# Patient Record
Sex: Female | Born: 1960 | Race: White | Hispanic: No | Marital: Married | Smoking: Never smoker
Health system: Southern US, Community
[De-identification: ages and names within clinical notes are randomized; demographics above are authoritative.]

---

## 2008-04-25 ENCOUNTER — Ambulatory Visit: Payer: Self-pay | Admitting: Physician Assistant

## 2008-06-07 ENCOUNTER — Ambulatory Visit: Payer: Self-pay | Admitting: Physician Assistant

## 2008-06-09 ENCOUNTER — Ambulatory Visit: Payer: Self-pay | Admitting: Physician Assistant

## 2009-09-17 ENCOUNTER — Ambulatory Visit: Payer: Self-pay | Admitting: Internal Medicine

## 2010-07-03 ENCOUNTER — Ambulatory Visit: Payer: Self-pay | Admitting: Internal Medicine
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
## 2010-09-10 ENCOUNTER — Ambulatory Visit: Payer: Self-pay | Admitting: Internal Medicine

## 2010-09-18 ENCOUNTER — Ambulatory Visit: Payer: Self-pay | Admitting: Internal Medicine

## 2010-10-30 ENCOUNTER — Ambulatory Visit: Payer: Self-pay | Admitting: Internal Medicine

## 2011-02-07 ENCOUNTER — Ambulatory Visit: Payer: Self-pay | Admitting: Internal Medicine

## 2011-03-24 ENCOUNTER — Ambulatory Visit: Payer: Self-pay | Admitting: Internal Medicine

## 2011-08-21 ENCOUNTER — Ambulatory Visit: Payer: Self-pay | Admitting: Internal Medicine

## 2011-11-25 ENCOUNTER — Ambulatory Visit: Payer: Self-pay
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
## 2012-10-13 IMAGING — CR DG KNEE 1-2V*L*
1 series · 2 of 2 positions shown · non-contrast
Comparison: none

REASON FOR EXAM: joint pain mult jts
COMMENTS:

PROCEDURE:     MDR - MDR KNEE LT AP AND LATERAL  - September 18, 2010  [DATE]
RESULT:     Comparison: None.

[Series 1: view not recorded · 0.17mm/px · 2 of 2 slices shown]
[im 1/2]
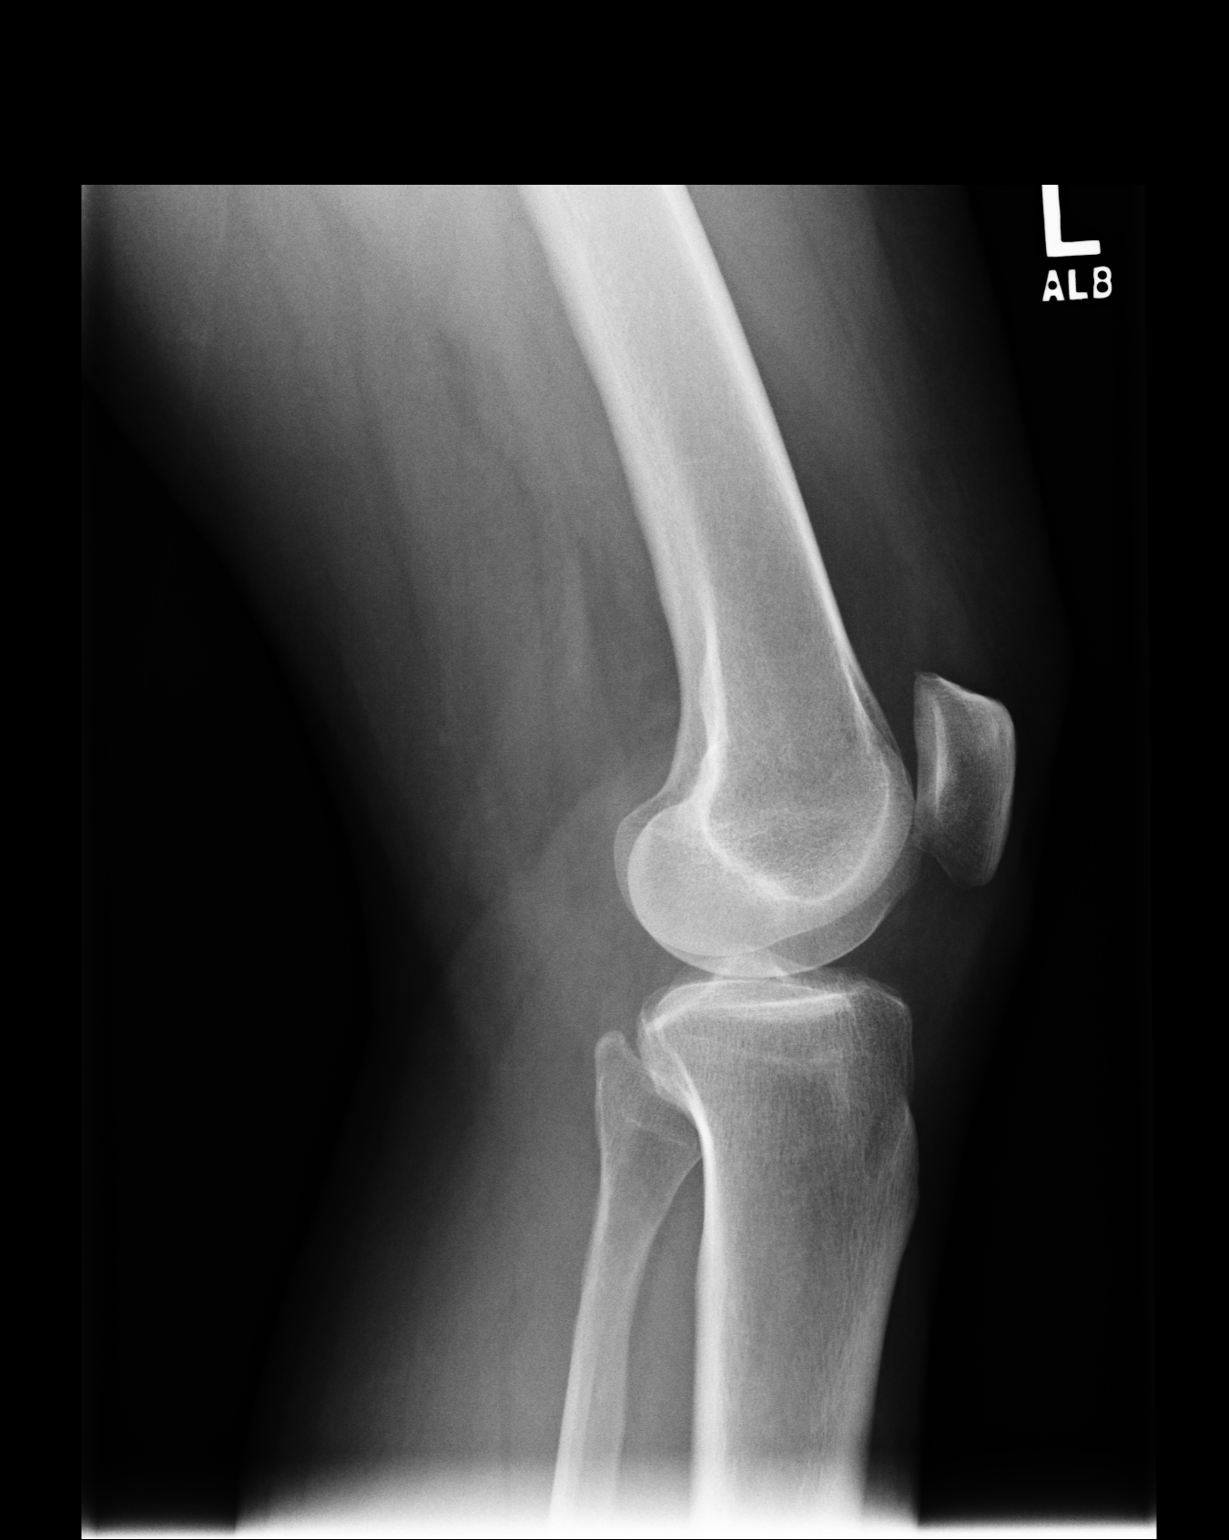
[im 2/2]
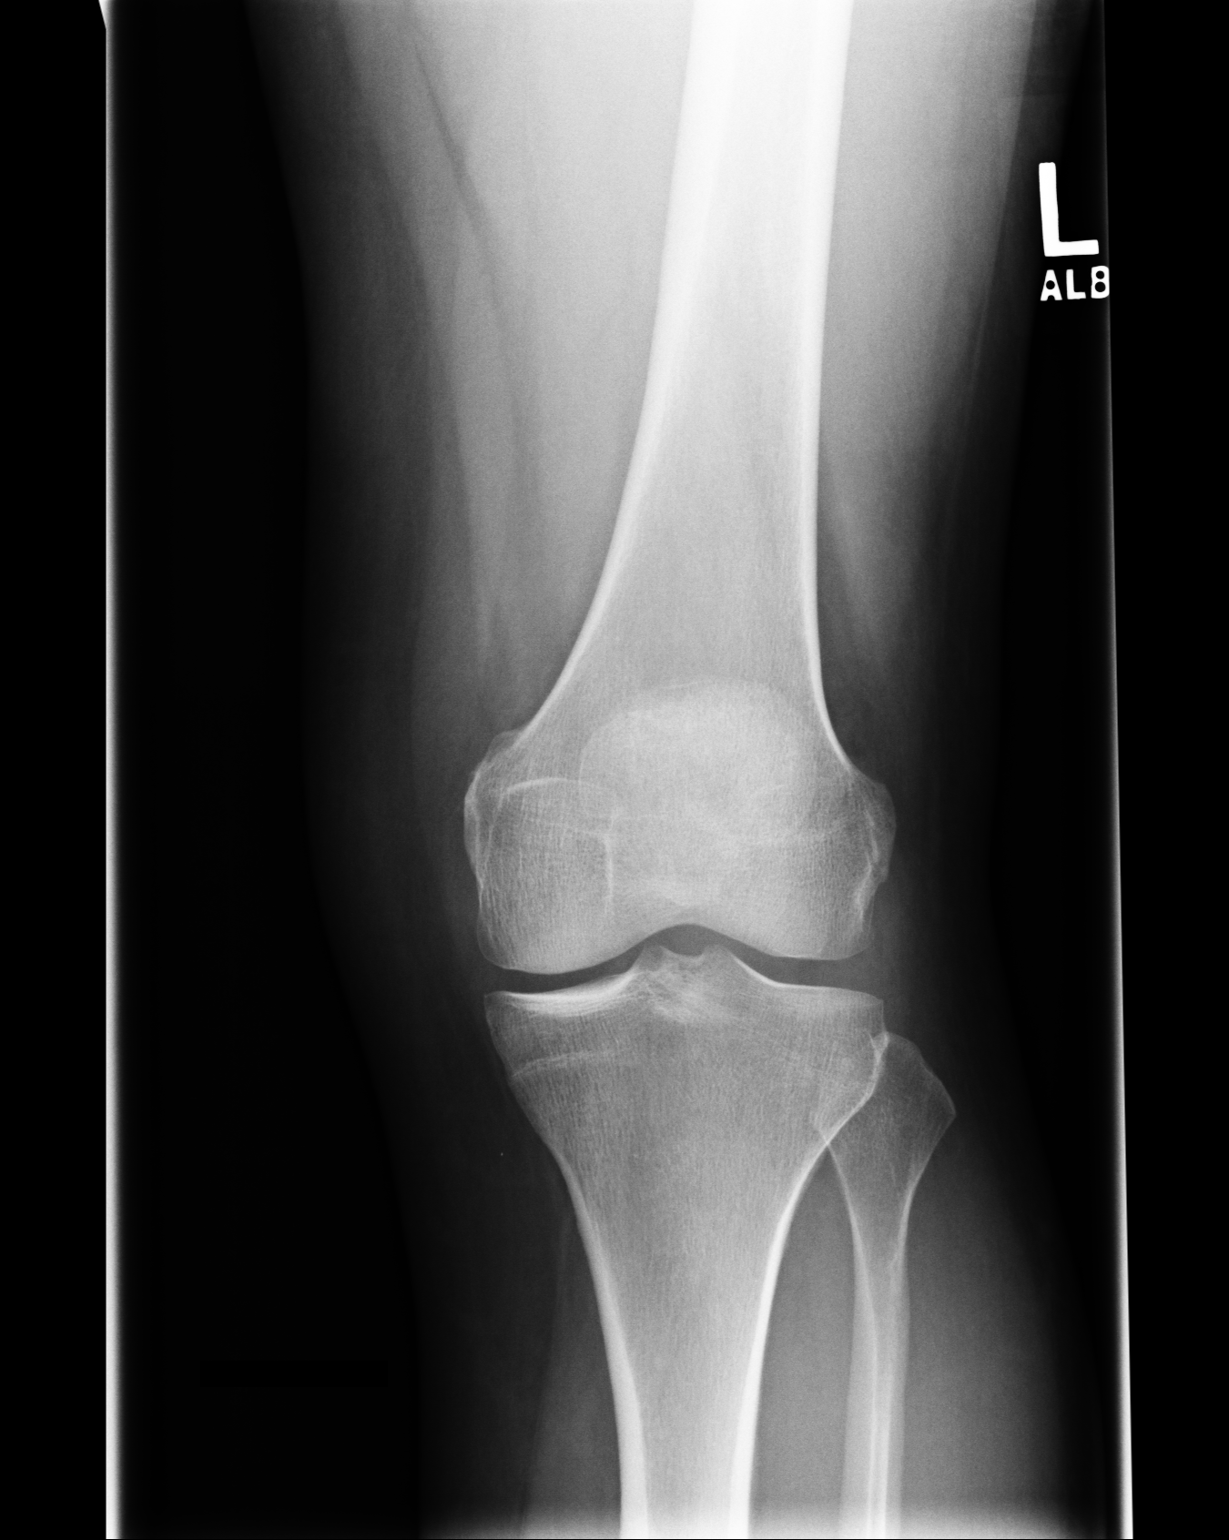

[2 of 2 positions shown; findings below may reference images not displayed]

FINDINGS: Negative for effusion. No acute fracture. Joint spaces are maintained.
IMPRESSION: Unremarkable left knee.

## 2013-04-14 ENCOUNTER — Ambulatory Visit: Payer: Self-pay | Admitting: Internal Medicine

## 2013-04-14 LAB — BASIC METABOLIC PANEL
BUN: 9 mg/dL (ref 4–21)
CREATININE: 0.7 mg/dL (ref <=1.1)

## 2013-04-14 LAB — TSH: TSH: 0.8 u[IU]/mL (ref <=5.90)

## 2013-04-14 LAB — LIPID PANEL
CHOLESTEROL: 198 mg/dL (ref 0–200)
HDL: 43 mg/dL (ref 35–70)
LDL Cholesterol: 108 mg/dL
Triglycerides: 235 mg/dL — AB (ref 40–160)

## 2013-04-14 LAB — CBC AND DIFFERENTIAL: Hemoglobin: 14.3 g/dL (ref 12.0–16.0)

## 2013-05-31 HISTORY — PX: COLONOSCOPY: SHX174

## 2013-09-28 LAB — HM PAP SMEAR: HM PAP: NEGATIVE

## 2015-06-23 ENCOUNTER — Telehealth: Payer: Self-pay

## 2015-06-23 ENCOUNTER — Other Ambulatory Visit: Payer: Self-pay | Admitting: Internal Medicine

## 2015-06-23 DIAGNOSIS — Z1239 Encounter for other screening for malignant neoplasm of breast: Secondary | ICD-10-CM

## 2015-06-23 NOTE — Telephone Encounter (Signed)
Patient called and wants mammogram scheduled and she has appointment for physical on December 27.

## 2015-07-17 ENCOUNTER — Encounter: Payer: Self-pay | Admitting: Internal Medicine

## 2015-07-17 DIAGNOSIS — A6000 Herpesviral infection of urogenital system, unspecified: Secondary | ICD-10-CM | POA: Insufficient documentation

## 2015-07-17 DIAGNOSIS — R609 Edema, unspecified: Secondary | ICD-10-CM | POA: Insufficient documentation

## 2015-07-17 DIAGNOSIS — R87619 Unspecified abnormal cytological findings in specimens from cervix uteri: Secondary | ICD-10-CM | POA: Insufficient documentation

## 2015-09-26 ENCOUNTER — Encounter: Payer: Self-pay | Admitting: Internal Medicine

## 2015-09-26 ENCOUNTER — Ambulatory Visit (INDEPENDENT_AMBULATORY_CARE_PROVIDER_SITE_OTHER): Payer: BLUE CROSS/BLUE SHIELD | Admitting: Internal Medicine

## 2015-09-26 ENCOUNTER — Ambulatory Visit
Admission: RE | Admit: 2015-09-26 | Discharge: 2015-09-26 | Disposition: A | Discharge status: Home / Self Care | Payer: BLUE CROSS/BLUE SHIELD | Source: Ambulatory Visit | Attending: Internal Medicine | Admitting: Internal Medicine

## 2015-09-26 VITALS — BP 120/78 | HR 84 | Ht 60.0 in | Wt 156.4 lb

## 2015-09-26 DIAGNOSIS — R7989 Other specified abnormal findings of blood chemistry: Secondary | ICD-10-CM

## 2015-09-26 DIAGNOSIS — M129 Arthropathy, unspecified: Secondary | ICD-10-CM

## 2015-09-26 DIAGNOSIS — Z1159 Encounter for screening for other viral diseases: Secondary | ICD-10-CM

## 2015-09-26 DIAGNOSIS — E782 Mixed hyperlipidemia: Secondary | ICD-10-CM

## 2015-09-26 DIAGNOSIS — R739 Hyperglycemia, unspecified: Secondary | ICD-10-CM

## 2015-09-26 DIAGNOSIS — Z0000 Encounter for general adult medical examination without abnormal findings: Secondary | ICD-10-CM | POA: Diagnosis not present

## 2015-09-26 DIAGNOSIS — M19012 Primary osteoarthritis, left shoulder: Secondary | ICD-10-CM | POA: Insufficient documentation

## 2015-09-26 DIAGNOSIS — Z114 Encounter for screening for human immunodeficiency virus [HIV]: Secondary | ICD-10-CM

## 2015-09-26 DIAGNOSIS — E119 Type 2 diabetes mellitus without complications: Secondary | ICD-10-CM | POA: Insufficient documentation

## 2015-09-26 DIAGNOSIS — R945 Abnormal results of liver function studies: Secondary | ICD-10-CM

## 2015-09-26 DIAGNOSIS — Z1239 Encounter for other screening for malignant neoplasm of breast: Secondary | ICD-10-CM

## 2015-09-26 DIAGNOSIS — Z1231 Encounter for screening mammogram for malignant neoplasm of breast: Secondary | ICD-10-CM | POA: Insufficient documentation

## 2015-09-26 LAB — POCT URINALYSIS DIPSTICK
BILIRUBIN UA: NEGATIVE
Blood, UA: NEGATIVE
GLUCOSE UA: NEGATIVE
KETONES UA: NEGATIVE
LEUKOCYTES UA: NEGATIVE
Nitrite, UA: NEGATIVE
PROTEIN UA: NEGATIVE
SPEC GRAV UA: 1.01
Urobilinogen, UA: 0.2
pH, UA: 7

## 2015-09-26 NOTE — Progress Notes (Signed)
Date:  09/26/2015   Name:  Meredith Mahoney   DOB:  1961-01-01   MRN:  [id number]   Chief Complaint: Annual Exam Meredith Mahoney is a 54 y.o. female who presents today for her Complete Annual Exam. She feels well. She reports exercising none regularly. She reports she is sleeping poorly due to shift hour work staring at 3 AM. She is due for a mammogram.  She denies breast problems.  She has not had a menstrual cycle since 08/2013.  She has mild hot flashes throughout the day but these are tolerable.  She prefers not to take medication. Shoulder Pain  The pain is present in the left shoulder. This is a recurrent problem. The current episode started more than 1 month ago. There has been no history of extremity trauma. The problem occurs daily. The problem has been unchanged. Pertinent negatives include no fever. 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 She has tried acetaminophen for the symptoms.     Review of Systems  Constitutional: Negative for fever, chills, fatigue and unexpected weight change.  HENT: Negative for congestion, hearing loss, sinus pressure, trouble swallowing and voice change.   Eyes: Negative for visual disturbance.  Respiratory: Negative for chest tightness, shortness of breath and wheezing.   Cardiovascular: Negative for chest pain, palpitations and leg swelling.  Gastrointestinal: Negative for abdominal pain, constipation and blood in stool.  Genitourinary: Negative for vaginal bleeding, vaginal discharge, vaginal pain and dyspareunia.  Musculoskeletal: Positive for arthralgias. Negative for joint swelling and gait problem.  Neurological: Negative for tremors and headaches.  Hematological: Negative for adenopathy. Does not bruise/bleed easily.  Psychiatric/Behavioral: Positive for sleep disturbance (due to shift work). Negative for dysphoric mood.    Patient Active Problem List   Diagnosis Date Noted  . Elevated LFTs 09/26/2015    . Hyperglycemia 09/26/2015  . Mixed hyperlipidemia 09/26/2015  . Arthritis of shoulder region, left 09/26/2015  . Dependent edema 07/17/2015  . Abnormal cervical Papanicolaou smear 07/17/2015  . Genital herpes 07/17/2015    Prior to Admission medications   Not on File    No Known Allergies  Past Surgical History  Procedure Laterality Date  . Colonoscopy  05/2013    one polyp - f/u 3 yr    Social History  Substance Use Topics  . Smoking status: Never Smoker   . Smokeless tobacco: None  . Alcohol Use: 1.2 oz/week    2 Standard drinks or equivalent per week    Medication list has been reviewed and updated.  Physical Exam  Constitutional: She is oriented to person, place, and time. She appears well-developed and well-nourished. No distress.  HENT:  Head: Normocephalic and atraumatic.  Right Ear: Tympanic membrane and ear canal normal.  Left Ear: Tympanic membrane and ear canal normal.  Nose: Right sinus exhibits no maxillary sinus tenderness. Left sinus exhibits no maxillary sinus tenderness.  Mouth/Throat: Uvula is midline and oropharynx is clear and moist.  Eyes: Conjunctivae and EOM are normal. Right eye exhibits no discharge. Left eye exhibits no discharge.  Neck: Normal range of motion. Carotid bruit is not present. No erythema present. No thyromegaly present.  Cardiovascular: Normal rate, regular rhythm, normal heart sounds and normal pulses.   Pulmonary/Chest: Effort normal. No respiratory distress. She has no wheezes. Right breast exhibits no mass, no nipple discharge, no skin change and no tenderness. Left breast exhibits no mass, no nipple discharge, no skin change and no tenderness.  Abdominal:  Soft. Bowel sounds are normal. There is no hepatosplenomegaly. There is no tenderness. There is no CVA tenderness.  Musculoskeletal:       Left shoulder: She exhibits bony tenderness. She exhibits no swelling, no effusion and no crepitus.  Normal range of motion in all  directions but mild discomfort over [facility] joint  Lymphadenopathy:    She has no cervical adenopathy.    She has no axillary adenopathy.  Neurological: She is alert and oriented to person, place, and time. She has normal reflexes. No cranial nerve deficit or sensory deficit.  Skin: Skin is warm, dry and intact. No rash noted.  Psychiatric: She has a normal mood and affect. Her speech is normal and behavior is normal. Thought content normal.  Nursing note and vitals reviewed.   BP 120/78 mmHg  Pulse 84  Ht 5' (1.524 m)  Wt 156 lb 6.4 oz (70.943 kg)  BMI 30.54 kg/m2  Assessment and Plan: 1. Annual physical exam Now in menopause since 08/2013 Minimal symptoms present - HRT not indicated at this time Pt will schedule Mammogram - CBC with Differential/Platelet  2. Mixed hyperlipidemia Continue healthy diet - Lipid panel  3. Elevated LFTs Avoid high doses of nsaids - Comprehensive metabolic panel  4. Hyperglycemia - TSH - Hemoglobin A1c  5. Need for hepatitis C screening test - Hepatitis C antibody  6. Encounter for screening for HIV - HIV antibody  7. Arthritis of shoulder region, left Continue tylenol as needed Refer to Ortho if worsening   Bari Edward, MD [facility]  09/26/2015

## 2015-09-26 NOTE — Patient Instructions (Signed)
Breast Self-Awareness Practicing breast self-awareness may pick up problems early, prevent significant medical complications, and possibly save your life. By practicing breast self-awareness, you can become familiar with how your breasts look and feel and if your breasts are changing. This allows you to notice changes early. It can also offer you some reassurance that your breast health is good. One way to learn what is normal for your breasts and whether your breasts are changing is to do a breast self-exam. If you find a lump or something that was not present in the past, it is best to contact your caregiver right away. Other findings that should be evaluated by your caregiver include nipple discharge, especially if it is bloody; skin changes or reddening; areas where the skin seems to be pulled in (retracted); or new lumps and bumps. Breast pain is seldom associated with cancer (malignancy), but should also be evaluated by a caregiver. HOW TO PERFORM A BREAST SELF-EXAM The best time to examine your breasts is 5-7 days after your menstrual period is over. During menstruation, the breasts are lumpier, and it may be more difficult to pick up changes. If you do not menstruate, have reached menopause, or had your uterus removed (hysterectomy), you should examine your breasts at regular intervals, such as monthly. If you are breastfeeding, examine your breasts after a feeding or after using a breast pump. Breast implants do not decrease the risk for lumps or tumors, so continue to perform breast self-exams as recommended. Talk to your caregiver about how to determine the difference between the implant and breast tissue. Also, talk about the amount of pressure you should use during the exam. Over time, you will become more familiar with the variations of your breasts and more comfortable with the exam. A breast self-exam requires you to remove all your clothes above the waist. 1. Look at your breasts and nipples.  Stand in front of a mirror in a room with good lighting. With your hands on your hips, push your hands firmly downward. Look for a difference in shape, contour, and size from one breast to the other (asymmetry). Asymmetry includes puckers, dips, or bumps. Also, look for skin changes, such as reddened or scaly areas on the breasts. Look for nipple changes, such as discharge, dimpling, repositioning, or redness. 2. Carefully feel your breasts. This is best done either in the shower or tub while using soapy water or when flat on your back. Place the arm (on the side of the breast you are examining) above your head. Use the pads (not the fingertips) of your three middle fingers on your opposite hand to feel your breasts. Start in the underarm area and use  inch (2 cm) overlapping circles to feel your breast. Use 3 different levels of pressure (light, medium, and firm pressure) at each circle before moving to the next circle. The light pressure is needed to feel the tissue closest to the skin. The medium pressure will help to feel breast tissue a little deeper, while the firm pressure is needed to feel the tissue close to the ribs. Continue the overlapping circles, moving downward over the breast until you feel your ribs below your breast. Then, move one finger-width towards the center of the body. Continue to use the  inch (2 cm) overlapping circles to feel your breast as you move slowly up toward the collar bone (clavicle) near the base of the neck. Continue the up and down exam using all 3 pressures until you reach the   middle of the chest. Do this with each breast, carefully feeling for lumps or changes. 3.  Keep a written record with breast changes or normal findings for each breast. By writing this information down, you do not need to depend only on memory for size, tenderness, or location. Write down where you are in your menstrual cycle, if you are still menstruating. Breast tissue can have some lumps or  thick tissue. However, see your caregiver if you find anything that concerns you.  SEEK MEDICAL CARE IF:  You see a change in shape, contour, or size of your breasts or nipples.   You see skin changes, such as reddened or scaly areas on the breasts or nipples.   You have an unusual discharge from your nipples.   You feel a new lump or unusually thick areas.    This information is not intended to replace advice given to you by your health care provider. Make sure you discuss any questions you have with your health care provider.   Document Released: 09/16/2005 Document Revised: 09/02/2012 Document Reviewed: 01/01/2012 Elsevier Interactive Patient Education 2016 [employer]  

## 2015-09-27 ENCOUNTER — Other Ambulatory Visit: Payer: Self-pay | Admitting: Internal Medicine

## 2015-09-27 DIAGNOSIS — E119 Type 2 diabetes mellitus without complications: Secondary | ICD-10-CM

## 2015-09-27 LAB — COMPREHENSIVE METABOLIC PANEL
ALBUMIN: 5 g/dL (ref 3.5–5.5)
ALT: 74 IU/L — ABNORMAL HIGH (ref 0–32)
AST: 61 IU/L — AB (ref 0–40)
Albumin/Globulin Ratio: 1.6 (ref 1.1–2.5)
Alkaline Phosphatase: 108 IU/L (ref 39–117)
BUN / CREAT RATIO: 23 (ref 9–23)
BUN: 13 mg/dL (ref 6–24)
Bilirubin Total: 0.6 mg/dL (ref 0.0–1.2)
CALCIUM: 10.4 mg/dL — AB (ref 8.7–10.2)
CO2: 27 mmol/L (ref 18–29)
CREATININE: 0.56 mg/dL — AB (ref 0.57–1.00)
Chloride: 98 mmol/L (ref 96–106)
GFR calc Af Amer: 123 mL/min/{1.73_m2} (ref >59)
GFR, EST NON AFRICAN AMERICAN: 107 mL/min/{1.73_m2} (ref >59)
GLOBULIN, TOTAL: 3.2 g/dL (ref 1.5–4.5)
GLUCOSE: 129 mg/dL — AB (ref 65–99)
Potassium: 4.6 mmol/L (ref 3.5–5.2)
SODIUM: 144 mmol/L (ref 134–144)
Total Protein: 8.2 g/dL (ref 6.0–8.5)

## 2015-09-27 LAB — CBC WITH DIFFERENTIAL/PLATELET
BASOS: 1 %
Basophils Absolute: 0.1 10*3/uL (ref 0.0–0.2)
EOS (ABSOLUTE): 0.2 10*3/uL (ref 0.0–0.4)
EOS: 3 %
HEMATOCRIT: 44.1 % (ref 34.0–46.6)
Hemoglobin: 14.7 g/dL (ref 11.1–15.9)
IMMATURE GRANS (ABS): 0 10*3/uL (ref 0.0–0.1)
IMMATURE GRANULOCYTES: 1 %
LYMPHS: 22 %
Lymphocytes Absolute: 1.3 10*3/uL (ref 0.7–3.1)
MCH: 30 pg (ref 26.6–33.0)
MCHC: 33.3 g/dL (ref 31.5–35.7)
MCV: 90 fL (ref 79–97)
Monocytes Absolute: 0.4 10*3/uL (ref 0.1–0.9)
Monocytes: 7 %
NEUTROS PCT: 66 %
Neutrophils Absolute: 3.9 10*3/uL (ref 1.4–7.0)
PLATELETS: 210 10*3/uL (ref 150–379)
RBC: 4.9 x10E6/uL (ref 3.77–5.28)
RDW: 15.1 % (ref 12.3–15.4)
WBC: 5.8 10*3/uL (ref 3.4–10.8)

## 2015-09-27 LAB — HIV ANTIBODY (ROUTINE TESTING W REFLEX): HIV SCREEN 4TH GENERATION: NONREACTIVE

## 2015-09-27 LAB — LIPID PANEL
Chol/HDL Ratio: 5.5 ratio units — ABNORMAL HIGH (ref 0.0–4.4)
Cholesterol, Total: 237 mg/dL — ABNORMAL HIGH (ref 100–199)
HDL: 43 mg/dL (ref >39)
LDL Calculated: 137 mg/dL — ABNORMAL HIGH (ref 0–99)
TRIGLYCERIDES: 284 mg/dL — AB (ref 0–149)
VLDL CHOLESTEROL CAL: 57 mg/dL — AB (ref 5–40)

## 2015-09-27 LAB — HEMOGLOBIN A1C
Est. average glucose Bld gHb Est-mCnc: 148 mg/dL
Hgb A1c MFr Bld: 6.8 % — ABNORMAL HIGH (ref 4.8–5.6)

## 2015-09-27 LAB — HEPATITIS C ANTIBODY: Hep C Virus Ab: <0.1 s/co ratio (ref 0.0–0.9)

## 2015-09-27 LAB — TSH: TSH: 0.837 u[IU]/mL (ref 0.450–4.500)

## 2015-10-03 ENCOUNTER — Encounter: Payer: Self-pay | Admitting: *Deleted

## 2015-10-03 ENCOUNTER — Encounter: Payer: BLUE CROSS/BLUE SHIELD | Attending: Internal Medicine | Admitting: *Deleted

## 2015-10-03 VITALS — BP 130/88 | Ht 60.0 in | Wt 159.6 lb

## 2015-10-03 DIAGNOSIS — E119 Type 2 diabetes mellitus without complications: Secondary | ICD-10-CM | POA: Diagnosis present

## 2015-10-03 NOTE — Patient Instructions (Addendum)
Check blood sugars 2 x day before breakfast and 2 hrs after supper 3-4 x week Exercise:  Begin walking for 15 minutes  3 days a week and gradually increase to 30 minutes 5 x week Eat 3 meals day,  1-2 snacks a day Space meals 4-6 hours apart Avoid sugar sweetened drinks (juices) Bring blood sugar records to the next class Call your doctor for a prescription for:  1. Meter strips (type) One Touch Ultra Blue  checking  3-4 times per week  2. Lancets (type) One Touch Delica  checking  3-4  times per week

## 2015-10-03 NOTE — Progress Notes (Signed)
Diabetes Self-Management Education  Visit Type: First/Initial  Appt. Start Time: 1310 Appt. End Time: 1430  10/03/2015  Ms. Christin BachKathleen Baiza, identified by name and date of birth, is a 55 y.o. female with a diagnosis of Diabetes: Type 2.   ASSESSMENT  Blood pressure 130/88, height 5' (1.524 m), weight 159 lb 9.6 oz (72.394 kg). Body mass index is 31.17 kg/(m^2).      Diabetes Self-Management Education - 10/03/15 1552    Visit Information   Visit Type First/Initial   Initial Visit   Diabetes Type Type 2   Are you currently following a meal plan? No   Are you taking your medications as prescribed? Yes   Date Diagnosed 09/26/15   Health Coping   How would you rate your overall health? Good   Psychosocial Assessment   Patient Belief/Attitude about Diabetes Other (comment)  "confused"   Self-care barriers None   Self-management support Doctor's office;Family   Patient Concerns Weight Control;Problem Solving   Special Needs None   Preferred Learning Style Visual   Learning Readiness Ready   How often do you need to have someone help you when you read instructions, pamphlets, or other written materials from your doctor or pharmacy? 1 - Never   What is the last grade level you completed in school? BS   Complications   Last HgB A1C per patient/outside source 6.8 %  09/26/15   How often do you check your blood sugar? 0 times/day (not testing)  Provided One Touch Ultra Mini meter and instructed on use. BG upon return demonstration was 119 mg/dL at 1:612:15 pm - 4 hrs pp.   Have you had a dilated eye exam in the past 12 months? Yes   Have you had a dental exam in the past 12 months? Yes   Are you checking your feet? No   Dietary Intake   Breakfast 2:30 am - eggs, sausage or bacon, toast   Lunch sandwich with lettuce and tomoato   Snack (afternoon) fruit and yogurt   Dinner meat and vegetables, rice, salad   Beverage(s) water, juice coffee    Exercise   Exercise Type ADL's   Patient  Education   Previous Diabetes Education No   Disease state  Definition of diabetes, type 1 and 2, and the diagnosis of diabetes   Nutrition management  Role of diet in the treatment of diabetes and the relationship between the three main macronutrients and blood glucose level   Physical activity and exercise  Role of exercise on diabetes management, blood pressure control and cardiac health.   Monitoring Taught/evaluated SMBG meter.;Purpose and frequency of SMBG.;Identified appropriate SMBG and/or A1C goals.   Chronic complications Relationship between chronic complications and blood glucose control   Psychosocial adjustment Identified and addressed patients feelings and concerns about diabetes   Individualized Goals (developed by patient)   Reducing Risk Become more fit Lose weight   Outcomes   Expected Outcomes Demonstrated interest in learning. Expect positive outcomes      Individualized Plan for Diabetes Self-Management Training:   Learning Objective:  Patient will have a greater understanding of diabetes self-management. Patient education plan is to attend individual and/or group sessions per assessed needs and concerns.   Plan:   Patient Instructions  Check blood sugars 2 x day before breakfast and 2 hrs after supper 3-4 x week Exercise:  Begin walking for 15 minutes  3 days a week and gradually increase to 30 minutes 5 x week Eat 3 meals day,  1-2 snacks a day Space meals 4-6 hours apart Avoid sugar sweetened drinks (juices) Bring blood sugar records to the next class Call your doctor for a prescription for:  1. Meter strips (type) One Touch Ultra Blue  checking  3-4 times per week  2. Lancets (type) One Touch Delica  checking  3-4  times per week   Expected Outcomes:  Demonstrated interest in learning. Expect positive outcomes  Education material provided:  General Meal Planning Guidelines Simple Meal Plan Meter - One Touch Ultra Mini  If problems or questions,  patient to contact team via:   Sharion Settler, RN, CCM, CDE [phone]  Future DSME appointment:   Tuesday October 10, 2015 for Class 1 in [place]

## 2015-10-04 ENCOUNTER — Other Ambulatory Visit: Payer: Self-pay | Admitting: Internal Medicine

## 2015-10-04 ENCOUNTER — Telehealth: Payer: Self-pay

## 2015-10-04 DIAGNOSIS — E119 Type 2 diabetes mellitus without complications: Secondary | ICD-10-CM

## 2015-10-04 MED ORDER — ONETOUCH DELICA LANCETS 33G MISC: 1.0000 | Freq: Two times a day (BID) | Status: DC

## 2015-10-04 MED ORDER — GLUCOSE BLOOD VI STRP: ORAL_STRIP | Status: DC

## 2015-10-04 NOTE — Telephone Encounter (Signed)
Patient states that she was newly diagnosed with Diabetes and that [facility]. She states that she needs to have her Lancets and Strips sent in for the meter they provided her. She states that she received a One Touch Ultra Mini.

## 2015-10-10 ENCOUNTER — Encounter: Payer: Self-pay | Admitting: Dietician

## 2015-10-10 ENCOUNTER — Encounter: Payer: BLUE CROSS/BLUE SHIELD | Admitting: Dietician

## 2015-10-10 VITALS — Wt 156.4 lb

## 2015-10-10 DIAGNOSIS — E119 Type 2 diabetes mellitus without complications: Secondary | ICD-10-CM | POA: Diagnosis not present

## 2015-10-10 NOTE — Progress Notes (Signed)
Appt. Start Time: 1300 Appt. End Time: 1500  Class 1 Diabetes Overview - define DM; state own type of DM; identify functions of pancreas and insulin; define insulin deficiency vs insulin resistance  Psychosocial - identify DM as a source of stress; state the effects of stress on BG control; verbalize appropriate stress management techniques; identify personal stress issues   Nutritional Management - describe effects of food on blood glucose; identify sources of carbohydrate, protein and fat; verbalize the importance of balance meals in controlling blood glucose; identify meals as well balanced or not; estimate servings of carbohydrate from menus; use food labels to identify servings size, content of carbohydrate, fiber, protein, fat, saturated fat and sodium; recognize food sources of fat, saturated fat, trans fat, sodium and verbalize goals for intake; describe healthful appropriate food choices when dining out   Exercise - describe the effects of exercise on blood glucose and importance of regular exercise in controlling diabetes; state a plan for personal exercise; verbalize contraindications for exercise  Self-Monitoring - state importance of HBGM and demo procedure accurately; use HBGM results to effectively manage diabetes; identify importance of regular HbA1C testing and goals for results  Acute Complications/Sick Day Guidelines - recognize hyperglycemia and hypoglycemia with causes and effects; identify blood glucose results as high, low or in control; list steps in treating and preventing high and low blood glucose; state appropriate measure to manage blood glucose when ill (need for meds, HBGM plan, when to call physician, need for fluids)  Chronic Complications/Foot, Skin, Eye Dental Care - identify possible long-term complications of diabetes (retinopathy, neuropathy, nephropathy, cardiovascular disease, infections); explain steps in prevention and treatment of chronic complications; state  importance of daily self-foot exams; describe how to examine feet and what to look for; explain appropriate eye and dental care  Lifestyle Changes/Goals & Health/Community Resources - state benefits of making appropriate lifestyle changes; identify habits that need to change (meals, tobacco, alcohol); identify strategies to reduce risk factors for personal health; set goals for proper diabetes care; state need for and frequency of healthcare follow-up; describe appropriate community resources for good health (ADA, web sites, apps)   Pregnancy/Sexual Health - define gestational diabetes; state importance of good blood glucose control and birth control prior to pregnancy; state importance of good blood glucose control in preventing sexual problems (impotence, vaginal dryness, infections, loss of desire); state relationship of blood glucose control and pregnancy outcome; describe risk of maternal and fetal complications  Teaching Materials Used: Class 1 Slides/Notebook Diabetes Booklet ID Card  Medic Alert/Medic ID Forms Sleep Evaluation Planning a Balanced Meal Goals for Class

## 2015-10-17 ENCOUNTER — Encounter: Payer: BLUE CROSS/BLUE SHIELD | Admitting: *Deleted

## 2015-10-17 ENCOUNTER — Encounter: Payer: Self-pay | Admitting: *Deleted

## 2015-10-17 VITALS — Wt 154.4 lb

## 2015-10-17 DIAGNOSIS — E119 Type 2 diabetes mellitus without complications: Secondary | ICD-10-CM | POA: Diagnosis not present

## 2015-10-17 NOTE — Progress Notes (Signed)
Appt. Start Time: 0900 Appt. End Time: 1100  [place] Class 2 Diabetes Overview - define DM; state own type of DM; identify functions of pancreas and insulin; define insulin deficiency vs insulin resistance  Psychosocial - identify DM as a source of stress; state the effects of stress on BG control; verbalize appropriate stress management techniques; identify personal stress issues   Nutritional Management - describe effects of food on blood glucose; identify sources of carbohydrate, protein and fat; verbalize the importance of balance meals in controlling blood glucose; identify meals as well balanced or not; estimate servings of carbohydrate from menus; use food labels to identify servings size, content of carbohydrate, fiber, protein, fat, saturated fat and sodium; recognize food sources of fat, saturated fat, trans fat, sodium and verbalize goals for intake; describe healthful appropriate food choices when dining out   Exercise - describe the effects of exercise on blood glucose and importance of regular exercise in controlling diabetes; state a plan for personal exercise; verbalize contraindications for exercise  Medications - state name, dose, timing of currently prescribed medications; describe types of medications available for diabetes  Self-Monitoring - state importance of HBGM and demo procedure accurately; use HBGM results to effectively manage diabetes; identify importance of regular HbA1C testing and goals for results  Acute Complications/Sick Day Guidelines - recognize hyperglycemia and hypoglycemia with causes and effects; identify blood glucose results as high, low or in control; list steps in treating and preventing high and low blood glucose; state appropriate measure to manage blood glucose when ill (need for meds, HBGM plan, when to call physician, need for fluids)  Lifestyle Changes/Goals & Health/Community Resources - state benefits of making appropriate lifestyle changes;  identify habits that need to change (meals, tobacco, alcohol); identify strategies to reduce risk factors for personal health; set goals for proper diabetes care; state need for and frequency of healthcare follow-up; describe appropriate community resources for good health (ADA, web sites, apps)   Location manager Used: Class 2 Slide Packet Exercise Handout Daily Food Record and Menu Ideas Goals for Class 2

## 2015-10-24 ENCOUNTER — Encounter: Payer: BLUE CROSS/BLUE SHIELD | Admitting: *Deleted

## 2015-10-24 ENCOUNTER — Encounter: Payer: Self-pay | Admitting: *Deleted

## 2015-10-24 VITALS — Wt 153.6 lb

## 2015-10-24 DIAGNOSIS — E119 Type 2 diabetes mellitus without complications: Secondary | ICD-10-CM

## 2015-10-24 NOTE — Progress Notes (Signed)
Appt. Start Time: 0900 Appt. End Time: 1100  [place] Class 3 Psychosocial - identify DM as a source of stress; state the effects of stress on BG control; verbalize appropriate stress management techniques; identify personal stress issues   Exercise - describe the effects of exercise on blood glucose and importance of regular exercise in controlling diabetes; state a plan for personal exercise; verbalize contraindications for exercise  Self-Monitoring - state importance of HBGM and demo procedure accurately; use HBGM results to effectively manage diabetes; identify importance of regular HbA1C testing and goals for results  Acute Complications/Sick Day Guidelines - recognize hyperglycemia and hypoglycemia with causes and effects; identify blood glucose results as high, low or in control; list steps in treating and preventing high and low blood glucose; state appropriate measure to manage blood glucose when ill (need for meds, HBGM plan, when to call physician, need for fluids)  Chronic Complications/Foot, Skin, Eye Dental Care - identify possible long-term complications of diabetes (retinopathy, neuropathy, nephropathy, cardiovascular disease, infections); explain steps in prevention and treatment of chronic complications; state importance of daily self-foot exams; describe how to examine feet and what to look for; explain appropriate eye and dental care  Lifestyle Changes/Goals & Health/Community Resources - state benefits of making appropriate lifestyle changes; identify habits that need to change (meals, tobacco, alcohol); identify strategies to reduce risk factors for personal health; set goals for proper diabetes care; state need for and frequency of healthcare follow-up; describe appropriate community resources for good health (ADA, web sites, apps)   Pregnancy/Sexual Health - define gestational diabetes; state importance of good blood glucose control and birth control prior to pregnancy; state  importance of good blood glucose control in preventing sexual problems (impotence, vaginal dryness, infections, loss of desire); state relationship of blood glucose control and pregnancy outcome; describe risk of maternal and fetal complications  Teaching Materials Used: Class 3 Slide Packet  Hgb A1C Handout Foot Care Handout Diabetes Stress Test Stress Management Tools Goals for Class 3    

## 2015-10-31 ENCOUNTER — Encounter: Payer: BLUE CROSS/BLUE SHIELD | Admitting: Dietician

## 2015-10-31 ENCOUNTER — Encounter: Payer: Self-pay | Admitting: Dietician

## 2015-10-31 VITALS — BP 130/78 | Wt 153.4 lb

## 2015-10-31 DIAGNOSIS — E119 Type 2 diabetes mellitus without complications: Secondary | ICD-10-CM | POA: Diagnosis not present

## 2015-10-31 NOTE — Progress Notes (Signed)
Appt. Start Time: 9:00  Appt. End Time: 10:30am  [place] Class 4 Nutritional Management - describe effects of food on blood glucose; identify sources of carbohydrate, protein and fat; verbalize the importance of balance meals in controlling blood glucose; identify meals as well balanced or not; estimate servings of carbohydrate from menus; use food labels to identify servings size, content of carbohydrate, fiber, protein, fat, saturated fat and sodium; recognize food sources of fat, saturated fat, trans fat, sodium and verbalize goals for intake; describe healthful appropriate food choices when dining out   Self-Monitoring - state importance of HBGM and demo procedure accurately; use HBGM results to effectively manage diabetes; identify importance of regular HbA1C testing and goals for results  Chronic Complications/Foot, Skin, Eye Dental Care - identify possible long-term complications of diabetes (retinopathy, neuropathy, nephropathy, cardiovascular disease, infections); explain steps in prevention and treatment of chronic complications; state importance of daily self-foot exams; describe how to examine feet and what to look for; explain appropriate eye and dental care  Lifestyle Changes/Goals & Health/Community Resources - state benefits of making appropriate lifestyle changes; identify habits that need to change (meals, tobacco, alcohol); identify strategies to reduce risk factors for personal health; set goals for proper diabetes care; state need for and frequency of healthcare follow-up; describe appropriate community resources for good health (ADA, web sites, apps)   Location manager Used: Class 4 Slide Packet  Recipe/Menu Booklet Nutrition Prescription Fast Food Information Goal Setting Worksheet

## 2015-11-10 ENCOUNTER — Encounter: Payer: Self-pay | Admitting: *Deleted

## 2016-01-23 ENCOUNTER — Encounter: Payer: Self-pay | Admitting: Internal Medicine

## 2016-01-23 ENCOUNTER — Ambulatory Visit (INDEPENDENT_AMBULATORY_CARE_PROVIDER_SITE_OTHER): Payer: BLUE CROSS/BLUE SHIELD | Admitting: Internal Medicine

## 2016-01-23 VITALS — BP 118/62 | HR 70 | Ht 60.0 in | Wt 144.0 lb

## 2016-01-23 DIAGNOSIS — R7989 Other specified abnormal findings of blood chemistry: Secondary | ICD-10-CM | POA: Diagnosis not present

## 2016-01-23 DIAGNOSIS — E119 Type 2 diabetes mellitus without complications: Secondary | ICD-10-CM | POA: Diagnosis not present

## 2016-01-23 DIAGNOSIS — R945 Abnormal results of liver function studies: Secondary | ICD-10-CM

## 2016-01-23 NOTE — Progress Notes (Signed)
Date:  01/23/2016   Name:  Meredith Mahoney   DOB:  Nov 29, 1960   MRN:  [id number]   Chief Complaint: Diabetes Diabetes She presents for her follow-up diabetic visit. She has type 2 (onset 6 months ago - went to DM ed and has done well.) diabetes mellitus. Her disease course has been stable. There are no hypoglycemic associated symptoms. Pertinent negatives for hypoglycemia include no dizziness or headaches. Pertinent negatives for diabetes include no chest pain, no fatigue, no polydipsia, no polyuria and no weakness. Current diabetic treatment includes diet. She is compliant with treatment all of the time. Her weight is decreasing steadily (has lost 15 lbs). She has had a previous visit with a dietitian.  She is very motivated and pleased with her progress.  She is checking BS 1-2 times per day.  Readings are less than 130 90% of the time.  She is exercising 4-5 times per week.  Lab Results  Component Value Date   HGBA1C 6.8* 09/26/2015   Lab Results  Component Value Date   ALT 74* 09/26/2015   AST 61* 09/26/2015   ALKPHOS 108 09/26/2015   BILITOT 0.6 09/26/2015     Review of Systems  Constitutional: Positive for unexpected weight change (has lost 15 lbs with diet and exercise). Negative for fever, chills and fatigue.  Eyes: Negative for visual disturbance.  Respiratory: Negative for cough, chest tightness and shortness of breath.   Cardiovascular: Negative for chest pain, palpitations and leg swelling.  Endocrine: Negative for polydipsia and polyuria.  Skin: Negative for rash.  Neurological: Negative for dizziness, weakness and headaches.    Patient Active Problem List   Diagnosis Date Noted  . Elevated LFTs 09/26/2015  . DM type 2, goal A1c below 7 09/26/2015  . Mixed hyperlipidemia 09/26/2015  . Arthritis of shoulder region, left 09/26/2015  . Dependent edema 07/17/2015  . Abnormal cervical Papanicolaou smear 07/17/2015  . Genital herpes 07/17/2015    Prior to  Admission medications   Medication Sig Start Date End Date Taking? Authorizing Provider  Ascorbic Acid (VITAMIN C) 1000 MG tablet Take 1,000 mg by mouth daily.    Historical Provider, MD  aspirin 81 MG tablet Take 81 mg by mouth daily.    Historical Provider, MD  calcium-vitamin D 250-100 MG-UNIT tablet Take 1 tablet by mouth daily.     Historical Provider, MD  Cholecalciferol (VITAMIN D-3 PO) Take 1,000 Units by mouth daily.    Historical Provider, MD  CRANBERRY PO Take 2 capsules by mouth daily.    Historical Provider, MD  glucose blood (ONE TOUCH ULTRA TEST) test strip Use as instructed 10/04/15   Reubin Milan, MD  Multiple Vitamins-Minerals (MULTIVITAMIN WITH MINERALS) tablet Take 1 tablet by mouth daily.    Historical Provider, MD  [facility] DELICA LANCETS 33G MISC 1 each by Does not apply route 2 (two) times daily. 10/04/15   Reubin Milan, MD  saccharomyces boulardii (FLORASTOR) 250 MG capsule Take 250 mg by mouth daily.     Historical Provider, MD    No Known Allergies  Past Surgical History  Procedure Laterality Date  . Colonoscopy  05/2013    one polyp - f/u 3 yr    Social History  Substance Use Topics  . Smoking status: Never Smoker   . Smokeless tobacco: Never Used  . Alcohol Use: 0.6 oz/week    1 Glasses of wine per week     Comment: 2 lite beers in last week  Medication list has been reviewed and updated.   Physical Exam  Constitutional: She is oriented to person, place, and time. She appears well-developed. No distress.  HENT:  Head: Normocephalic and atraumatic.  Cardiovascular: Normal rate, regular rhythm and normal heart sounds.   Pulmonary/Chest: Effort normal and breath sounds normal. No respiratory distress.  Musculoskeletal: She exhibits no edema or tenderness.  Neurological: She is alert and oriented to person, place, and time.  Normal sensation, pulses, skin on both feet bilaterally  Skin: Skin is warm and dry. No rash noted.  Psychiatric: She has  a normal mood and affect. Her behavior is normal. Thought content normal.  Nursing note and vitals reviewed.   BP 118/62 mmHg  Pulse 70  Ht 5' (1.524 m)  Wt 144 lb (65.318 kg)  BMI 28.12 kg/m2  Assessment and Plan: 1. DM type 2, goal A1c below 7 Excellent control with diet, weight loss and exercise Continue FSBS daily - can alternate fasting and 2 hr PC If A1C <6.5 will stay the course and re-evaluate at [facility] this fall - Hemoglobin A1c  2. Elevated LFTs Hep C negative Suspect transient effect vs mild fatty liver which should have improved with weight loss and dietary changes - Hepatic function panel   Bari EdwardLaura Jaanvi Fizer, MD [facility]  01/23/2016

## 2016-01-24 ENCOUNTER — Telehealth: Payer: Self-pay

## 2016-01-24 LAB — HEPATIC FUNCTION PANEL
ALK PHOS: 103 IU/L (ref 39–117)
ALT: 24 IU/L (ref 0–32)
AST: 29 IU/L (ref 0–40)
Albumin: 4.8 g/dL (ref 3.5–5.5)
BILIRUBIN, DIRECT: 0.1 mg/dL (ref 0.00–0.40)
Bilirubin Total: 0.4 mg/dL (ref 0.0–1.2)
Total Protein: 7.9 g/dL (ref 6.0–8.5)

## 2016-01-24 LAB — HEMOGLOBIN A1C
Est. average glucose Bld gHb Est-mCnc: 111 mg/dL
Hgb A1c MFr Bld: 5.5 % (ref 4.8–5.6)

## 2016-01-24 NOTE — Telephone Encounter (Signed)
Spoke with patient. Patient advised of all results and verbalized understanding. Will call back with any future questions or concerns. MAH  

## 2016-01-24 NOTE — Telephone Encounter (Signed)
-----   Message from Reubin MilanLaura H Berglund, MD sent at 01/24/2016  8:28 AM EDT ----- A1C is down to 5.5.  Liver functions are normal.  Great work - keep it up!

## 2016-06-24 ENCOUNTER — Encounter: Payer: Self-pay | Admitting: *Deleted

## 2016-08-13 ENCOUNTER — Other Ambulatory Visit: Payer: Self-pay | Admitting: Internal Medicine

## 2016-08-14 ENCOUNTER — Other Ambulatory Visit: Payer: Self-pay | Admitting: Internal Medicine

## 2016-08-14 DIAGNOSIS — Z1231 Encounter for screening mammogram for malignant neoplasm of breast: Secondary | ICD-10-CM

## 2016-09-17 ENCOUNTER — Ambulatory Visit
Admission: RE | Admit: 2016-09-17 | Discharge: 2016-09-17 | Disposition: A | Discharge status: Home / Self Care | Payer: BLUE CROSS/BLUE SHIELD | Source: Ambulatory Visit | Attending: Internal Medicine | Admitting: Internal Medicine

## 2016-09-17 DIAGNOSIS — Z1231 Encounter for screening mammogram for malignant neoplasm of breast: Secondary | ICD-10-CM | POA: Insufficient documentation

## 2016-09-26 ENCOUNTER — Encounter: Payer: Self-pay | Admitting: Internal Medicine

## 2016-09-26 ENCOUNTER — Ambulatory Visit (INDEPENDENT_AMBULATORY_CARE_PROVIDER_SITE_OTHER): Payer: BLUE CROSS/BLUE SHIELD | Admitting: Internal Medicine

## 2016-09-26 ENCOUNTER — Other Ambulatory Visit: Payer: Self-pay | Admitting: Internal Medicine

## 2016-09-26 VITALS — BP 126/82 | HR 68 | Temp 98.6°F | Ht 60.0 in | Wt 150.0 lb

## 2016-09-26 DIAGNOSIS — E119 Type 2 diabetes mellitus without complications: Secondary | ICD-10-CM | POA: Diagnosis not present

## 2016-09-26 DIAGNOSIS — Z0000 Encounter for general adult medical examination without abnormal findings: Secondary | ICD-10-CM | POA: Diagnosis not present

## 2016-09-26 DIAGNOSIS — E782 Mixed hyperlipidemia: Secondary | ICD-10-CM

## 2016-09-26 DIAGNOSIS — R945 Abnormal results of liver function studies: Secondary | ICD-10-CM

## 2016-09-26 DIAGNOSIS — R7989 Other specified abnormal findings of blood chemistry: Secondary | ICD-10-CM

## 2016-09-26 DIAGNOSIS — R609 Edema, unspecified: Secondary | ICD-10-CM

## 2016-09-26 LAB — POCT URINALYSIS DIPSTICK
BILIRUBIN UA: NEGATIVE
Blood, UA: NEGATIVE
Glucose, UA: NEGATIVE
Ketones, UA: NEGATIVE
LEUKOCYTES UA: NEGATIVE
NITRITE UA: NEGATIVE
PH UA: 6
Protein, UA: NEGATIVE
Spec Grav, UA: 1.01
Urobilinogen, UA: 0.2

## 2016-09-26 NOTE — Progress Notes (Signed)
Date:  09/26/2016   Name:  Meredith Mahoney   DOB:  [birth year]   MRN:  [id number]   Chief Complaint: Annual Exam Meredith Mahoney is a 55 y.o. female who presents today for her Complete Annual Exam. She feels well. She reports exercising regularly. She reports she is sleeping well. She recently had her mammogram.  She continues to exercise but has gained back some of the weight she gained. She has a colonoscopy scheduled for January. She declines influenza vaccine.  Diabetes  She presents for her follow-up diabetic visit. She has type 2 diabetes mellitus. Her disease course has been stable. Pertinent negatives for hypoglycemia include no dizziness, headaches, nervousness/anxiousness or tremors. Pertinent negatives for diabetes include no chest pain, no fatigue, no polydipsia and no polyuria. Her weight is fluctuating minimally. She is following a generally healthy diet. She participates in exercise three times a week. There is no change in her home blood glucose trend. An ACE inhibitor/angiotensin II receptor blocker is not being taken.  Hyperlipidemia  This is a chronic problem. Pertinent negatives include no chest pain or shortness of breath. Current antihyperlipidemic treatment includes diet change and exercise.   Lab Results  Component Value Date   HGBA1C 5.5 01/23/2016     Review of Systems  Constitutional: Negative for chills, fatigue and fever.  HENT: Negative for congestion, hearing loss, tinnitus, trouble swallowing and voice change.   Eyes: Negative for visual disturbance.  Respiratory: Negative for cough, chest tightness, shortness of breath and wheezing.   Cardiovascular: Negative for chest pain, palpitations and leg swelling.  Gastrointestinal: Negative for abdominal pain, constipation, diarrhea and vomiting.  Endocrine: Negative for polydipsia and polyuria.  Genitourinary: Negative for dysuria, frequency, genital sores, vaginal bleeding and vaginal discharge.    Musculoskeletal: Negative for arthralgias, gait problem and joint swelling.  Skin: Negative for color change and rash.  Neurological: Negative for dizziness, tremors, light-headedness and headaches.  Hematological: Negative for adenopathy. Does not bruise/bleed easily.  Psychiatric/Behavioral: Negative for dysphoric mood and sleep disturbance. The patient is not nervous/anxious.     Patient Active Problem List   Diagnosis Date Noted  . Elevated LFTs 09/26/2015  . Type 2 diabetes mellitus with hemoglobin A1c goal of less than 7.0% (HCC) 09/26/2015  . Mixed hyperlipidemia 09/26/2015  . Arthritis of shoulder region, left 09/26/2015  . Dependent edema 07/17/2015  . Abnormal cervical Papanicolaou smear 07/17/2015  . Genital herpes 07/17/2015    Prior to Admission medications   Medication Sig Start Date End Date Taking? Authorizing Provider  Ascorbic Acid (VITAMIN C) 1000 MG tablet Take 1,000 mg by mouth daily.   Yes Historical Provider, MD  aspirin 81 MG tablet Take 81 mg by mouth daily.   Yes Historical Provider, MD  Biotin [id number] MCG TABS Take by mouth.   Yes Historical Provider, MD  Cholecalciferol (VITAMIN D-3 PO) Take 1,000 Units by mouth daily.   Yes Historical Provider, MD  CRANBERRY PO Take 2 capsules by mouth daily.   Yes Historical Provider, MD  glucose blood (ONE TOUCH ULTRA TEST) test strip Use as instructed 10/04/15  Yes Reubin MilanLaura H Kenni Newton, MD  Multiple Vitamins-Minerals (MULTIVITAMIN WITH MINERALS) tablet Take 1 tablet by mouth daily.   Yes Historical Provider, MD  [facility] DELICA LANCETS 33G MISC 1 each by Does not apply route 2 (two) times daily. 10/04/15  Yes Reubin MilanLaura H Gianny Killman, MD  saccharomyces boulardii (FLORASTOR) 250 MG capsule Take 250 mg by mouth daily.    Yes Historical  Provider, MD    No Known Allergies  Past Surgical History:  Procedure Laterality Date  . COLONOSCOPY  05/2013   one polyp - f/u 3 yr    Social History  Substance Use Topics  . Smoking status: Never  Smoker  . Smokeless tobacco: Never Used  . Alcohol use 0.6 oz/week    1 Glasses of wine per week     Comment: 2 lite beers in last week     Medication list has been reviewed and updated.   Physical Exam  Constitutional: She is oriented to person, place, and time. She appears well-developed and well-nourished. No distress.  HENT:  Head: Normocephalic and atraumatic.  Right Ear: Tympanic membrane and ear canal normal.  Left Ear: Tympanic membrane and ear canal normal.  Nose: Right sinus exhibits no maxillary sinus tenderness. Left sinus exhibits no maxillary sinus tenderness.  Mouth/Throat: Uvula is midline and oropharynx is clear and moist.  Eyes: Conjunctivae and EOM are normal. Right eye exhibits no discharge. Left eye exhibits no discharge. No scleral icterus.  Neck: Normal range of motion. Carotid bruit is not present. No erythema present. No thyromegaly present.  Cardiovascular: Normal rate, regular rhythm, normal heart sounds and normal pulses.   Pulmonary/Chest: Effort normal. No respiratory distress. She has no wheezes. Right breast exhibits no mass, no nipple discharge, no skin change and no tenderness. Left breast exhibits no mass, no nipple discharge, no skin change and no tenderness.  Abdominal: Soft. Bowel sounds are normal. There is no hepatosplenomegaly. There is no tenderness. There is no CVA tenderness.  Genitourinary: Vagina normal and uterus normal. There is no tenderness, lesion or injury on the right labia. There is no tenderness, lesion or injury on the left labia. Cervix exhibits no motion tenderness. Right adnexum displays no mass, no tenderness and no fullness. Left adnexum displays no mass, no tenderness and no fullness.  Musculoskeletal: Normal range of motion.  Lymphadenopathy:    She has no cervical adenopathy.    She has no axillary adenopathy.  Neurological: She is alert and oriented to person, place, and time. She has normal reflexes. No cranial nerve  deficit or sensory deficit.  Skin: Skin is warm, dry and intact. No rash noted.  Psychiatric: She has a normal mood and affect. Her speech is normal and behavior is normal. Thought content normal.  Nursing note and vitals reviewed.   BP 126/82   Pulse 68   Temp 98.6 F (37 C)   Ht 5' (1.524 m)   Wt 150 lb (68 kg)   SpO2 99%   BMI 29.29 kg/m   Assessment and Plan: 1. Annual physical exam Continue diet and exercise - Pap IG and HPV (high risk) DNA detection - POCT urinalysis dipstick  2. Type 2 diabetes mellitus with hemoglobin A1c goal of less than 7.0% (HCC) - Hemoglobin A1c - TSH - Microalbumin / creatinine urine ratio  3. Elevated LFTs recheck - Comprehensive metabolic panel  4. Mixed hyperlipidemia Will advise if medication is needed - Lipid panel  5. Dependent edema resolved - CBC with Differential/Platelet   Bari EdwardLaura Nixxon Faria, MD [facility]  09/26/2016

## 2016-09-26 NOTE — Patient Instructions (Signed)
Breast Self-Awareness Introduction Breast self-awareness means being familiar with how your breasts look and feel. It involves checking your breasts regularly and reporting any changes to your health care provider. Practicing breast self-awareness is important. A change in your breasts can be a sign of a serious medical problem. Being familiar with how your breasts look and feel allows you to find any problems early, when treatment is more likely to be successful. All women should practice breast self-awareness, including women who have had breast implants. How to do a breast self-exam One way to learn what is normal for your breasts and whether your breasts are changing is to do a breast self-exam. To do a breast self-exam: Look for Changes  1. Remove all the clothing above your waist. 2. Stand in front of a mirror in a room with good lighting. 3. Put your hands on your hips. 4. Push your hands firmly downward. 5. Compare your breasts in the mirror. Look for differences between them (asymmetry), such as:  Differences in shape.  Differences in size.  Puckers, dips, and bumps in one breast and not the other. 6. Look at each breast for changes in your skin, such as:  Redness.  Scaly areas. 7. Look for changes in your nipples, such as:  Discharge.  Bleeding.  Dimpling.  Redness.  A change in position. Feel for Changes  Carefully feel your breasts for lumps and changes. It is best to do this while lying on your back on the floor and again while sitting or standing in the shower or tub with soapy water on your skin. Feel each breast in the following way:  Place the arm on the side of the breast you are examining above your head.  Feel your breast with the other hand.  Start in the nipple area and make  inch (2 cm) overlapping circles to feel your breast. Use the pads of your three middle fingers to do this. Apply light pressure, then medium pressure, then firm pressure. The light  pressure will allow you to feel the tissue closest to the skin. The medium pressure will allow you to feel the tissue that is a little deeper. The firm pressure will allow you to feel the tissue close to the ribs.  Continue the overlapping circles, moving downward over the breast until you feel your ribs below your breast.  Move one finger-width toward the center of the body. Continue to use the  inch (2 cm) overlapping circles to feel your breast as you move slowly up toward your collarbone.  Continue the up and down exam using all three pressures until you reach your armpit. Write Down What You Find  Write down what is normal for each breast and any changes that you find. Keep a written record with breast changes or normal findings for each breast. By writing this information down, you do not need to depend only on memory for size, tenderness, or location. Write down where you are in your menstrual cycle, if you are still menstruating. If you are having trouble noticing differences in your breasts, do not get discouraged. With time you will become more familiar with the variations in your breasts and more comfortable with the exam. How often should I examine my breasts? Examine your breasts every month. If you are breastfeeding, the best time to examine your breasts is after a feeding or after using a breast pump. If you menstruate, the best time to examine your breasts is 5-7 days after your   period is over. During your period, your breasts are lumpier, and it may be more difficult to notice changes. When should I see my health care provider? See your health care provider if you notice:  A change in shape or size of your breasts or nipples.  A change in the skin of your breast or nipples, such as a reddened or scaly area.  Unusual discharge from your nipples.  A lump or thick area that was not there before.  Pain in your breasts.  Anything that concerns you. This information is not  intended to replace advice given to you by your health care provider. Make sure you discuss any questions you have with your health care provider. Document Released: 09/16/2005 Document Revised: 02/22/2016 Document Reviewed: 08/06/2015  2017 [employer]vier  

## 2016-09-27 LAB — COMPREHENSIVE METABOLIC PANEL
A/G RATIO: 1.9 (ref 1.2–2.2)
ALBUMIN: 4.9 g/dL (ref 3.5–5.5)
ALK PHOS: 101 IU/L (ref 39–117)
ALT: 27 IU/L (ref 0–32)
AST: 25 IU/L (ref 0–40)
BUN / CREAT RATIO: 23 (ref 9–23)
BUN: 13 mg/dL (ref 6–24)
Bilirubin Total: 0.4 mg/dL (ref 0.0–1.2)
CO2: 24 mmol/L (ref 18–29)
Calcium: 10.3 mg/dL — ABNORMAL HIGH (ref 8.7–10.2)
Chloride: 99 mmol/L (ref 96–106)
Creatinine, Ser: 0.57 mg/dL (ref 0.57–1.00)
GFR calc Af Amer: 122 mL/min/{1.73_m2} (ref >59)
GFR calc non Af Amer: 105 mL/min/{1.73_m2} (ref >59)
GLOBULIN, TOTAL: 2.6 g/dL (ref 1.5–4.5)
Glucose: 109 mg/dL — ABNORMAL HIGH (ref 65–99)
POTASSIUM: 5.2 mmol/L (ref 3.5–5.2)
SODIUM: 140 mmol/L (ref 134–144)
Total Protein: 7.5 g/dL (ref 6.0–8.5)

## 2016-09-27 LAB — CBC WITH DIFFERENTIAL/PLATELET
BASOS: 1 %
Basophils Absolute: 0.1 10*3/uL (ref 0.0–0.2)
EOS (ABSOLUTE): 0.3 10*3/uL (ref 0.0–0.4)
EOS: 4 %
HEMATOCRIT: 42.5 % (ref 34.0–46.6)
HEMOGLOBIN: 14.1 g/dL (ref 11.1–15.9)
Immature Grans (Abs): 0 10*3/uL (ref 0.0–0.1)
Immature Granulocytes: 0 %
LYMPHS ABS: 1.7 10*3/uL (ref 0.7–3.1)
Lymphs: 26 %
MCH: 29.2 pg (ref 26.6–33.0)
MCHC: 33.2 g/dL (ref 31.5–35.7)
MCV: 88 fL (ref 79–97)
MONOS ABS: 0.5 10*3/uL (ref 0.1–0.9)
Monocytes: 7 %
NEUTROS ABS: 4.2 10*3/uL (ref 1.4–7.0)
Neutrophils: 62 %
Platelets: 223 10*3/uL (ref 150–379)
RBC: 4.83 x10E6/uL (ref 3.77–5.28)
RDW: 14.8 % (ref 12.3–15.4)
WBC: 6.7 10*3/uL (ref 3.4–10.8)

## 2016-09-27 LAB — LIPID PANEL
CHOL/HDL RATIO: 4.9 ratio — AB (ref 0.0–4.4)
Cholesterol, Total: 219 mg/dL — ABNORMAL HIGH (ref 100–199)
HDL: 45 mg/dL (ref >39)
LDL Calculated: 122 mg/dL — ABNORMAL HIGH (ref 0–99)
Triglycerides: 258 mg/dL — ABNORMAL HIGH (ref 0–149)
VLDL CHOLESTEROL CAL: 52 mg/dL — AB (ref 5–40)

## 2016-09-27 LAB — TSH: TSH: 1.2 u[IU]/mL (ref 0.450–4.500)

## 2016-09-27 LAB — MICROALBUMIN / CREATININE URINE RATIO
Creatinine, Urine: 12.4 mg/dL
Microalb/Creat Ratio: <24.2 mg/g creat (ref 0.0–30.0)
Microalbumin, Urine: <3 ug/mL

## 2016-09-27 LAB — HEMOGLOBIN A1C
ESTIMATED AVERAGE GLUCOSE: 103 mg/dL
HEMOGLOBIN A1C: 5.2 % (ref 4.8–5.6)

## 2016-09-28 LAB — PAP IG AND HPV HIGH-RISK
HPV, HIGH-RISK: NEGATIVE
PAP Smear Comment: 0

## 2016-11-29 ENCOUNTER — Other Ambulatory Visit: Payer: Self-pay | Admitting: Internal Medicine

## 2016-11-29 DIAGNOSIS — E119 Type 2 diabetes mellitus without complications: Secondary | ICD-10-CM

## 2017-02-28 ENCOUNTER — Other Ambulatory Visit: Payer: Self-pay | Admitting: Internal Medicine

## 2017-02-28 DIAGNOSIS — E119 Type 2 diabetes mellitus without complications: Secondary | ICD-10-CM

## 2017-03-24 ENCOUNTER — Ambulatory Visit (INDEPENDENT_AMBULATORY_CARE_PROVIDER_SITE_OTHER): Payer: BLUE CROSS/BLUE SHIELD | Admitting: Internal Medicine

## 2017-03-24 VITALS — BP 138/82 | HR 68 | Ht 60.0 in | Wt 145.0 lb

## 2017-03-24 DIAGNOSIS — E782 Mixed hyperlipidemia: Secondary | ICD-10-CM | POA: Diagnosis not present

## 2017-03-24 DIAGNOSIS — E119 Type 2 diabetes mellitus without complications: Secondary | ICD-10-CM

## 2017-03-24 DIAGNOSIS — R7989 Other specified abnormal findings of blood chemistry: Secondary | ICD-10-CM | POA: Diagnosis not present

## 2017-03-24 DIAGNOSIS — R945 Abnormal results of liver function studies: Secondary | ICD-10-CM

## 2017-03-24 NOTE — Progress Notes (Signed)
Date:  03/24/2017   Name:  Meredith Mahoney   DOB:  1961-08-28   MRN:  [id number]   Chief Complaint: Diabetes (BS this morning 121.) and Hyperlipidemia (Pt is fasting. ) Diabetes  She presents for her follow-up diabetic visit. She has type 2 diabetes mellitus. Her disease course has been stable. Pertinent negatives for hypoglycemia include no headaches or tremors. Pertinent negatives for diabetes include no chest pain, no fatigue, no polydipsia and no polyuria. Current diabetic treatment includes diet. She is compliant with treatment most of the time. Her weight is stable. An ACE inhibitor/angiotensin II receptor blocker is not being taken.  Hyperlipidemia  This is a chronic problem. Pertinent negatives include no chest pain or shortness of breath. Current antihyperlipidemic treatment includes nicotinic acid.    Lab Results  Component Value Date   HGBA1C 5.2 09/26/2016   Lab Results  Component Value Date   CHOL 219 (H) 09/26/2016   HDL 45 09/26/2016   LDLCALC 122 (H) 09/26/2016   TRIG 258 (H) 09/26/2016   CHOLHDL 4.9 (H) 09/26/2016     Review of Systems  Constitutional: Negative for appetite change, fatigue, fever and unexpected weight change.  HENT: Negative for tinnitus and trouble swallowing.   Eyes: Negative for visual disturbance.  Respiratory: Negative for cough, chest tightness and shortness of breath.   Cardiovascular: Negative for chest pain, palpitations and leg swelling.  Gastrointestinal: Negative for abdominal pain.  Endocrine: Negative for polydipsia and polyuria.  Genitourinary: Negative for dysuria and hematuria.  Musculoskeletal: Negative for arthralgias.  Neurological: Negative for tremors, numbness and headaches.  Psychiatric/Behavioral: Negative for dysphoric mood.    Patient Active Problem List   Diagnosis Date Noted  . Elevated LFTs 09/26/2015  . Type 2 diabetes mellitus with hemoglobin A1c goal of less than 7.0% (HCC) 09/26/2015  . Mixed  hyperlipidemia 09/26/2015  . Arthritis of shoulder region, left 09/26/2015  . Dependent edema 07/17/2015  . Abnormal cervical Papanicolaou smear 07/17/2015  . Genital herpes 07/17/2015    Prior to Admission medications   Medication Sig Start Date End Date Taking? Authorizing Provider  Ascorbic Acid (VITAMIN C) 1000 MG tablet Take 1,000 mg by mouth daily.    [provider]  aspirin 81 MG tablet Take 81 mg by mouth daily.    [provider]  Biotin 40981 MCG TABS Take by mouth.    [provider]  Cholecalciferol (VITAMIN D-3 PO) Take 1,000 Units by mouth daily.    [provider]  CRANBERRY PO Take 2 capsules by mouth daily.    [provider]  Multiple Vitamins-Minerals (MULTIVITAMIN WITH MINERALS) tablet Take 1 tablet by mouth daily.    [provider]  ONE TOUCH ULTRA TEST test strip USE AS INSTRUCTED 11/29/16   Reubin Milan, MD  [facility] DELICA LANCETS 33G MISC USE TWICE DAILY 02/28/17   Reubin Milan, MD  saccharomyces boulardii (FLORASTOR) 250 MG capsule Take 250 mg by mouth daily.     [provider]    No Known Allergies  Past Surgical History:  Procedure Laterality Date  . COLONOSCOPY  05/2013   one polyp - f/u 3 yr    Social History  Substance Use Topics  . Smoking status: Never Smoker  . Smokeless tobacco: Never Used  . Alcohol use 0.6 oz/week    1 Glasses of wine per week     Comment: 2 lite beers in last week     Medication list has been reviewed  and updated.   Physical Exam  Constitutional: She is oriented to person, place, and time. She appears well-developed. No distress.  HENT:  Head: Normocephalic and atraumatic.  Neck: Carotid bruit is not present.  Cardiovascular: Normal rate, regular rhythm and normal heart sounds.   Pulmonary/Chest: Effort normal and breath sounds normal. No respiratory distress. She has no wheezes.  Musculoskeletal: Normal range of motion.  Neurological: She is  alert and oriented to person, place, and time.  Skin: Skin is warm and dry. No rash noted.  Psychiatric: She has a normal mood and affect. Her behavior is normal. Thought content normal.  Nursing note and vitals reviewed.   BP 138/82   Pulse 68   Ht 5' (1.524 m)   Wt 145 lb (65.8 kg)   SpO2 99%   BMI 28.32 kg/m   Assessment and Plan: 1. Type 2 diabetes mellitus with hemoglobin A1c goal of less than 7.0% (HCC) Doing well on diet and exercise - Comprehensive metabolic panel - Hemoglobin A1c  2. Mixed hyperlipidemia Now on Niacin - recheck labs - Lipid panel  3. Elevated LFTs recheck   No orders of the defined types were placed in this encounter.   Bari EdwardLaura Guynell Kleiber, MD [facility]  03/24/2017

## 2017-03-25 LAB — LIPID PANEL
CHOLESTEROL TOTAL: 204 mg/dL — AB (ref 100–199)
Chol/HDL Ratio: 4.4 ratio (ref 0.0–4.4)
HDL: 46 mg/dL (ref >39)
LDL Calculated: 111 mg/dL — ABNORMAL HIGH (ref 0–99)
TRIGLYCERIDES: 236 mg/dL — AB (ref 0–149)
VLDL Cholesterol Cal: 47 mg/dL — ABNORMAL HIGH (ref 5–40)

## 2017-03-25 LAB — COMPREHENSIVE METABOLIC PANEL
A/G RATIO: 1.5 (ref 1.2–2.2)
ALK PHOS: 103 IU/L (ref 39–117)
ALT: 27 IU/L (ref 0–32)
AST: 32 IU/L (ref 0–40)
Albumin: 4.8 g/dL (ref 3.5–5.5)
BUN/Creatinine Ratio: 26 — ABNORMAL HIGH (ref 9–23)
BUN: 14 mg/dL (ref 6–24)
Bilirubin Total: 0.6 mg/dL (ref 0.0–1.2)
CALCIUM: 10.1 mg/dL (ref 8.7–10.2)
CO2: 24 mmol/L (ref 20–29)
Chloride: 101 mmol/L (ref 96–106)
Creatinine, Ser: 0.54 mg/dL — ABNORMAL LOW (ref 0.57–1.00)
GFR calc Af Amer: 124 mL/min/{1.73_m2} (ref >59)
GFR, EST NON AFRICAN AMERICAN: 107 mL/min/{1.73_m2} (ref >59)
GLOBULIN, TOTAL: 3.2 g/dL (ref 1.5–4.5)
Glucose: 93 mg/dL (ref 65–99)
POTASSIUM: 4.4 mmol/L (ref 3.5–5.2)
SODIUM: 143 mmol/L (ref 134–144)
Total Protein: 8 g/dL (ref 6.0–8.5)

## 2017-03-25 LAB — HEMOGLOBIN A1C
Est. average glucose Bld gHb Est-mCnc: 105 mg/dL
Hgb A1c MFr Bld: 5.3 % (ref 4.8–5.6)

## 2017-05-03 ENCOUNTER — Other Ambulatory Visit: Payer: Self-pay | Admitting: Internal Medicine

## 2017-05-03 DIAGNOSIS — E119 Type 2 diabetes mellitus without complications: Secondary | ICD-10-CM

## 2017-05-07 ENCOUNTER — Other Ambulatory Visit: Payer: Self-pay

## 2017-05-07 ENCOUNTER — Other Ambulatory Visit: Payer: Self-pay | Admitting: Internal Medicine

## 2017-05-07 ENCOUNTER — Encounter: Payer: Self-pay | Admitting: Internal Medicine

## 2017-05-07 DIAGNOSIS — E119 Type 2 diabetes mellitus without complications: Secondary | ICD-10-CM

## 2017-05-07 MED ORDER — ONETOUCH DELICA LANCETS 33G MISC: 1 refills | Status: AC

## 2017-08-29 ENCOUNTER — Other Ambulatory Visit: Payer: Self-pay | Admitting: Internal Medicine

## 2017-08-29 DIAGNOSIS — Z1231 Encounter for screening mammogram for malignant neoplasm of breast: Secondary | ICD-10-CM

## 2017-09-18 ENCOUNTER — Ambulatory Visit
Admission: RE | Admit: 2017-09-18 | Discharge: 2017-09-18 | Disposition: A | Discharge status: Home / Self Care | Source: Ambulatory Visit | Attending: Internal Medicine | Admitting: Internal Medicine

## 2017-09-18 DIAGNOSIS — Z1231 Encounter for screening mammogram for malignant neoplasm of breast: Secondary | ICD-10-CM | POA: Diagnosis not present

## 2017-09-26 ENCOUNTER — Other Ambulatory Visit: Payer: Self-pay

## 2017-09-26 DIAGNOSIS — E119 Type 2 diabetes mellitus without complications: Secondary | ICD-10-CM

## 2017-09-26 MED ORDER — GLUCOSE BLOOD VI STRP: ORAL_STRIP | 3 refills | Status: AC

## 2017-10-05 ENCOUNTER — Encounter: Payer: Self-pay | Admitting: Internal Medicine

## 2017-10-06 ENCOUNTER — Encounter: Payer: Self-pay | Admitting: Internal Medicine

## 2017-10-06 ENCOUNTER — Other Ambulatory Visit: Payer: Self-pay | Admitting: Internal Medicine

## 2017-10-06 ENCOUNTER — Ambulatory Visit (INDEPENDENT_AMBULATORY_CARE_PROVIDER_SITE_OTHER): Admitting: Internal Medicine

## 2017-10-06 VITALS — BP 124/78 | HR 78 | Ht 60.0 in | Wt 150.6 lb

## 2017-10-06 DIAGNOSIS — R7989 Other specified abnormal findings of blood chemistry: Secondary | ICD-10-CM

## 2017-10-06 DIAGNOSIS — Z0000 Encounter for general adult medical examination without abnormal findings: Secondary | ICD-10-CM

## 2017-10-06 DIAGNOSIS — R945 Abnormal results of liver function studies: Secondary | ICD-10-CM

## 2017-10-06 DIAGNOSIS — E119 Type 2 diabetes mellitus without complications: Secondary | ICD-10-CM | POA: Diagnosis not present

## 2017-10-06 DIAGNOSIS — L03115 Cellulitis of right lower limb: Secondary | ICD-10-CM | POA: Diagnosis not present

## 2017-10-06 DIAGNOSIS — E782 Mixed hyperlipidemia: Secondary | ICD-10-CM | POA: Diagnosis not present

## 2017-10-06 DIAGNOSIS — Z1239 Encounter for other screening for malignant neoplasm of breast: Secondary | ICD-10-CM

## 2017-10-06 DIAGNOSIS — Z0001 Encounter for general adult medical examination with abnormal findings: Secondary | ICD-10-CM

## 2017-10-06 LAB — POCT URINALYSIS DIPSTICK
BILIRUBIN UA: NEGATIVE
Glucose, UA: NEGATIVE
KETONES UA: NEGATIVE
Leukocytes, UA: NEGATIVE
NITRITE UA: NEGATIVE
PH UA: 6.5 (ref 5.0–8.0)
PROTEIN UA: NEGATIVE
RBC UA: NEGATIVE
SPEC GRAV UA: 1.01 (ref 1.010–1.025)
UROBILINOGEN UA: 0.2 U/dL

## 2017-10-06 MED ORDER — AMOXICILLIN-POT CLAVULANATE 875-125 MG PO TABS: 1.0000 | ORAL_TABLET | Freq: Two times a day (BID) | ORAL | 0 refills | Status: AC

## 2017-10-06 NOTE — Progress Notes (Signed)
Date:  10/06/2017   Name:  Meredith Mahoney   DOB:  [birth year]   MRN:  [id number]   Chief Complaint: Annual Exam (Breast Exam. ); Diabetes; and Hyperlipidemia Meredith Mahoney is a 57 y.o. female who presents today for her Complete Annual Exam. She feels fairly well. She reports exercising at the gym and walking. She reports she is sleeping fairly well. Mammogram is up to date.  She is retired and moving to [place] in the next few weeks.  She plans to establish with a new PCP - probably through [facility].    Diabetes  She presents for her follow-up diabetic visit. She has type 2 diabetes mellitus. Pertinent negatives for hypoglycemia include no dizziness, headaches, nervousness/anxiousness or tremors. Pertinent negatives for diabetes include no chest pain, no fatigue, no polydipsia and no polyuria. She monitors blood glucose at home 3-4 x per week. Blood glucose monitoring compliance is good. Her breakfast blood glucose is taken between 7-8 am. Her breakfast blood glucose range is generally 110-130 mg/dl. An ACE inhibitor/angiotensin II receptor blocker is not being taken.  Hyperlipidemia  This is a chronic problem. There are no known factors aggravating her hyperlipidemia. Pertinent negatives include no chest pain or shortness of breath.   Lab Results  Component Value Date   HGBA1C 5.3 03/24/2017      Review of Systems  Constitutional: Negative for chills, fatigue and fever.  HENT: Negative for congestion, hearing loss, tinnitus, trouble swallowing and voice change.   Eyes: Negative for visual disturbance.  Respiratory: Negative for cough, chest tightness, shortness of breath and wheezing.   Cardiovascular: Negative for chest pain, palpitations and leg swelling.  Gastrointestinal: Negative for abdominal pain, constipation, diarrhea and vomiting.  Endocrine: Negative for polydipsia and polyuria.  Genitourinary: Negative for dysuria, frequency, genital sores, vaginal bleeding and vaginal  discharge.  Musculoskeletal: Negative for arthralgias, gait problem and joint swelling.  Skin: Negative for color change and rash.  Neurological: Negative for dizziness, tremors, light-headedness and headaches.  Hematological: Negative for adenopathy. Does not bruise/bleed easily.  Psychiatric/Behavioral: Negative for dysphoric mood and sleep disturbance. The patient is not nervous/anxious.     Patient Active Problem List   Diagnosis Date Noted  . Elevated LFTs 09/26/2015  . Type 2 diabetes mellitus with hemoglobin A1c goal of less than 7.0% (HCC) 09/26/2015  . Mixed hyperlipidemia 09/26/2015  . Arthritis of shoulder region, left 09/26/2015  . Dependent edema 07/17/2015  . Abnormal cervical Papanicolaou smear 07/17/2015  . Genital herpes 07/17/2015    Prior to Admission medications   Medication Sig Start Date End Date Taking? Authorizing Provider  Ascorbic Acid (VITAMIN C) 1000 MG tablet Take 1,000 mg by mouth daily.    [provider]  aspirin 81 MG tablet Take 81 mg by mouth daily.    [provider]  Biotin 78295 MCG TABS Take by mouth.    [provider]  Cholecalciferol (VITAMIN D-3 PO) Take 1,000 Units by mouth daily.    [provider]  CRANBERRY PO Take 2 capsules by mouth daily.    [provider]  glucose blood (ONE TOUCH ULTRA TEST) test strip USE AS INSTRUCTED 09/26/17   Reubin Milan, MD  Multiple Vitamins-Minerals (MULTIVITAMIN WITH MINERALS) tablet Take 1 tablet by mouth daily.    [provider]  niacin (SLO-NIACIN) 500 MG tablet Take 500 mg by mouth at bedtime.    [provider]  [facility] DELICA LANCETS 33G MISC USE TWICE DAILY 05/07/17  Reubin MilanBerglund, Maytte Jacot H, MD  saccharomyces boulardii (FLORASTOR) 250 MG capsule Take 250 mg by mouth daily.     [provider]    No Known Allergies  Past Surgical History:  Procedure Laterality Date  . COLONOSCOPY  05/2013   one polyp - f/u 3 yr    Social  History   Tobacco Use  . Smoking status: Never Smoker  . Smokeless tobacco: Never Used  Substance Use Topics  . Alcohol use: Yes    Alcohol/week: 0.6 oz    Types: 1 Glasses of wine per week    Comment: 2 lite beers in last week  . Drug use: No     Medication list has been reviewed and updated.  PHQ 2/9 Scores 10/06/2017 10/03/2015  PHQ - 2 Score 0 0    Physical Exam  Constitutional: She is oriented to person, place, and time. She appears well-developed and well-nourished. No distress.  HENT:  Head: Normocephalic and atraumatic.  Right Ear: Tympanic membrane and ear canal normal.  Left Ear: Tympanic membrane and ear canal normal.  Nose: Right sinus exhibits no maxillary sinus tenderness. Left sinus exhibits no maxillary sinus tenderness.  Mouth/Throat: Uvula is midline and oropharynx is clear and moist.  Eyes: Conjunctivae and EOM are normal. Right eye exhibits no discharge. Left eye exhibits no discharge. No scleral icterus.  Neck: Normal range of motion. Carotid bruit is not present. No erythema present. No thyromegaly present.  Cardiovascular: Normal rate, regular rhythm, normal heart sounds and normal pulses.  Pulmonary/Chest: Effort normal. No respiratory distress. She has no wheezes. Right breast exhibits no mass, no nipple discharge, no skin change and no tenderness. Left breast exhibits no mass, no nipple discharge, no skin change and no tenderness.  Abdominal: Soft. Bowel sounds are normal. There is no hepatosplenomegaly. There is no tenderness. There is no CVA tenderness.  Musculoskeletal: Normal range of motion.  Lymphadenopathy:    She has no cervical adenopathy.    She has no axillary adenopathy.  Neurological: She is alert and oriented to person, place, and time. She has normal reflexes. No cranial nerve deficit or sensory deficit.  Skin: Skin is warm, dry and intact. No rash noted.     Psychiatric: She has a normal mood and affect. Her speech is normal and behavior  is normal. Thought content normal.  Nursing note and vitals reviewed.   BP 124/78   Pulse 78   Ht 5' (1.524 m)   Wt 150 lb 9.6 oz (68.3 kg)   SpO2 98%   BMI 29.41 kg/m   Assessment and Plan: 1. Annual physical exam She declines all immunizations - POCT urinalysis dipstick  2. Breast cancer screening Recent mammogram was normal  3. Type 2 diabetes mellitus with hemoglobin A1c goal of less than 7.0% (HCC) Continue diet and exercise - CBC with Differential/Platelet - Comprehensive metabolic panel - Hemoglobin A1c - Microalbumin / creatinine urine ratio - TSH  4. Mixed hyperlipidemia - Lipid panel  5. Elevated LFTs - Comprehensive metabolic panel  6. Cellulitis of right lower extremity - amoxicillin-clavulanate (AUGMENTIN) 875-125 MG tablet; Take 1 tablet by mouth 2 (two) times daily.  Dispense: 20 tablet; Refill: 0   Meds ordered this encounter  Medications  . amoxicillin-clavulanate (AUGMENTIN) 875-125 MG tablet    Sig: Take 1 tablet by mouth 2 (two) times daily.    Dispense:  20 tablet    Refill:  0    Partially dictated using Animal nutritionistDragon software. Any errors are unintentional.  Bari Edward, MD [facility]  10/06/2017

## 2017-10-07 LAB — COMPREHENSIVE METABOLIC PANEL
A/G RATIO: 1.5 (ref 1.2–2.2)
ALBUMIN: 4.8 g/dL (ref 3.5–5.5)
ALK PHOS: 93 IU/L (ref 39–117)
ALT: 31 IU/L (ref 0–32)
AST: 32 IU/L (ref 0–40)
BILIRUBIN TOTAL: 0.6 mg/dL (ref 0.0–1.2)
BUN / CREAT RATIO: 22 (ref 9–23)
BUN: 12 mg/dL (ref 6–24)
CO2: 20 mmol/L (ref 20–29)
Calcium: 10 mg/dL (ref 8.7–10.2)
Chloride: 100 mmol/L (ref 96–106)
Creatinine, Ser: 0.55 mg/dL — ABNORMAL LOW (ref 0.57–1.00)
GFR calc Af Amer: 122 mL/min/{1.73_m2} (ref >59)
GFR calc non Af Amer: 106 mL/min/{1.73_m2} (ref >59)
GLUCOSE: 111 mg/dL — AB (ref 65–99)
Globulin, Total: 3.2 g/dL (ref 1.5–4.5)
Potassium: 4.9 mmol/L (ref 3.5–5.2)
Sodium: 140 mmol/L (ref 134–144)
Total Protein: 8 g/dL (ref 6.0–8.5)

## 2017-10-07 LAB — CBC WITH DIFFERENTIAL/PLATELET
BASOS ABS: 0 10*3/uL (ref 0.0–0.2)
Basos: 1 %
EOS (ABSOLUTE): 0.2 10*3/uL (ref 0.0–0.4)
Eos: 3 %
HEMOGLOBIN: 14.3 g/dL (ref 11.1–15.9)
Hematocrit: 42.8 % (ref 34.0–46.6)
Immature Grans (Abs): 0.1 10*3/uL (ref 0.0–0.1)
Immature Granulocytes: 1 %
LYMPHS ABS: 1.4 10*3/uL (ref 0.7–3.1)
Lymphs: 24 %
MCH: 30.6 pg (ref 26.6–33.0)
MCHC: 33.4 g/dL (ref 31.5–35.7)
MCV: 92 fL (ref 79–97)
Monocytes Absolute: 0.4 10*3/uL (ref 0.1–0.9)
Monocytes: 7 %
NEUTROS ABS: 3.6 10*3/uL (ref 1.4–7.0)
Neutrophils: 64 %
PLATELETS: 212 10*3/uL (ref 150–379)
RBC: 4.67 x10E6/uL (ref 3.77–5.28)
RDW: 14.6 % (ref 12.3–15.4)
WBC: 5.7 10*3/uL (ref 3.4–10.8)

## 2017-10-07 LAB — LIPID PANEL
Chol/HDL Ratio: 4.4 ratio (ref 0.0–4.4)
Cholesterol, Total: 210 mg/dL — ABNORMAL HIGH (ref 100–199)
HDL: 48 mg/dL (ref >39)
LDL CALC: 118 mg/dL — AB (ref 0–99)
Triglycerides: 221 mg/dL — ABNORMAL HIGH (ref 0–149)
VLDL CHOLESTEROL CAL: 44 mg/dL — AB (ref 5–40)

## 2017-10-07 LAB — TSH: TSH: 1.11 u[IU]/mL (ref 0.450–4.500)

## 2017-10-07 LAB — HEMOGLOBIN A1C
ESTIMATED AVERAGE GLUCOSE: 108 mg/dL
Hgb A1c MFr Bld: 5.4 % (ref 4.8–5.6)

## 2017-10-08 LAB — MICROALBUMIN / CREATININE URINE RATIO: Creatinine, Urine: 17.1 mg/dL

## 2024-06-28 ENCOUNTER — Encounter

## 2024-06-28 ENCOUNTER — Other Ambulatory Visit: Payer: Self-pay

## 2024-06-28 ENCOUNTER — Encounter (HOSPITAL_COMMUNITY): Payer: Self-pay | Admitting: Emergency Medicine

## 2024-06-28 LAB — COMPREHENSIVE METABOLIC PANEL WITH GFR
ALT: 24 U/L (ref 0–44)
AST: 35 U/L (ref 15–41)
Albumin: 4.4 g/dL (ref 3.5–5.0)
Alkaline Phosphatase: 110 U/L (ref 38–126)
Anion gap: 15 (ref 5–15)
BUN: 13 mg/dL (ref 8–23)
CO2: 20 mmol/L — ABNORMAL LOW (ref 22–32)
Calcium: 9.4 mg/dL (ref 8.9–10.3)
Chloride: 106 mmol/L (ref 98–111)
Creatinine, Ser: 0.76 mg/dL (ref 0.44–1.00)
GFR, Estimated: >60 mL/min (ref >60)
Glucose, Bld: 156 mg/dL — ABNORMAL HIGH (ref 70–99)
Potassium: 3.8 mmol/L (ref 3.5–5.1)
Sodium: 141 mmol/L (ref 135–145)
Total Bilirubin: 0.5 mg/dL (ref 0.0–1.2)
Total Protein: 7.2 g/dL (ref 6.5–8.1)

## 2024-06-28 LAB — URINALYSIS, ROUTINE W REFLEX MICROSCOPIC
Bilirubin Urine: NEGATIVE
Glucose, UA: >=500 mg/dL — AB
Hgb urine dipstick: NEGATIVE
Ketones, ur: 5 mg/dL — AB
Leukocytes,Ua: NEGATIVE
Nitrite: NEGATIVE
Protein, ur: NEGATIVE mg/dL
Specific Gravity, Urine: 1.028 (ref 1.005–1.030)
pH: 5 (ref 5.0–8.0)

## 2024-06-28 LAB — RESP PANEL BY RT-PCR (RSV, FLU A&B, COVID)  RVPGX2
Influenza A by PCR: NEGATIVE
Influenza B by PCR: NEGATIVE
Resp Syncytial Virus by PCR: NEGATIVE
SARS Coronavirus 2 by RT PCR: NEGATIVE

## 2024-06-28 LAB — CBC
HCT: 42.8 % (ref 36.0–46.0)
Hemoglobin: 13.4 g/dL (ref 12.0–15.0)
MCH: 27.7 pg (ref 26.0–34.0)
MCHC: 31.3 g/dL (ref 30.0–36.0)
MCV: 88.4 fL (ref 80.0–100.0)
Platelets: 177 K/uL (ref 150–400)
RBC: 4.84 MIL/uL (ref 3.87–5.11)
RDW: 14.5 % (ref 11.5–15.5)
WBC: 5.5 K/uL (ref 4.0–10.5)
nRBC: 0 % (ref 0.0–0.2)

## 2024-06-28 NOTE — ED Triage Notes (Signed)
 Patient BIB EMS from work c/o headache x 1 hour. Patient report taking tylenol without relief. Patient report nausea, denies vomiting/diarrhea. Patient denies SOB and chest pain.
